# Patient Record
Sex: Female | Born: 1940 | Race: Black or African American | Hispanic: No | Marital: Single | Smoking: Never smoker
Health system: Southern US, Community
[De-identification: ages and names within clinical notes are randomized; demographics above are authoritative.]

## PROBLEM LIST (undated history)

## (undated) ENCOUNTER — Emergency Department (HOSPITAL_COMMUNITY): Admission: EM | Discharge status: Absent without leave | Payer: Medicare HMO

## (undated) DIAGNOSIS — G629 Polyneuropathy, unspecified: Secondary | ICD-10-CM

## (undated) DIAGNOSIS — I10 Essential (primary) hypertension: Secondary | ICD-10-CM

## (undated) DIAGNOSIS — E7800 Pure hypercholesterolemia, unspecified: Secondary | ICD-10-CM

## (undated) HISTORY — PX: ABDOMINAL HYSTERECTOMY: SHX81

## (undated) HISTORY — PX: BRAIN SURGERY: SHX531

## (undated) HISTORY — PX: CHOLECYSTECTOMY: SHX55

## (undated) HISTORY — PX: FOOT SURGERY: SHX648

---

## 2000-12-18 ENCOUNTER — Emergency Department (HOSPITAL_COMMUNITY): Admission: EM | Admit: 2000-12-18 | Discharge: 2000-12-19 | Payer: Self-pay | Admitting: Emergency Medicine

## 2000-12-18 ENCOUNTER — Encounter: Payer: Self-pay | Admitting: Emergency Medicine

## 2007-02-27 ENCOUNTER — Ambulatory Visit: Payer: Self-pay | Admitting: Cardiology

## 2008-05-23 DEATH — deceased

## 2017-09-21 ENCOUNTER — Emergency Department (HOSPITAL_COMMUNITY)
Admission: EM | Admit: 2017-09-21 | Discharge: 2017-09-22 | Disposition: A | Discharge status: Home / Self Care | Payer: Medicare HMO | Attending: Emergency Medicine | Admitting: Emergency Medicine

## 2017-09-21 ENCOUNTER — Other Ambulatory Visit: Payer: Self-pay

## 2017-09-21 ENCOUNTER — Encounter (HOSPITAL_COMMUNITY): Payer: Self-pay | Admitting: Emergency Medicine

## 2017-09-21 ENCOUNTER — Emergency Department (HOSPITAL_COMMUNITY): Payer: Medicare HMO

## 2017-09-21 DIAGNOSIS — M79672 Pain in left foot: Secondary | ICD-10-CM | POA: Diagnosis present

## 2017-09-21 DIAGNOSIS — I10 Essential (primary) hypertension: Secondary | ICD-10-CM | POA: Insufficient documentation

## 2017-09-21 HISTORY — DX: Polyneuropathy, unspecified: G62.9

## 2017-09-21 HISTORY — DX: Pure hypercholesterolemia, unspecified: E78.00

## 2017-09-21 HISTORY — DX: Essential (primary) hypertension: I10

## 2017-09-21 NOTE — ED Triage Notes (Signed)
Pt c/o left ankle/foot pain since yesterday with swelling. Pt denies any injury.

## 2017-09-22 NOTE — ED Provider Notes (Signed)
[facility] EMERGENCY DEPARTMENT Provider Note   CSN: [id number] Arrival date & time: 09/21/17  2159     History   Chief Complaint Chief Complaint  Patient presents with  . Foot Pain    HPI Catherine Hines is a 77 y.o. female with history of HLD, HTN, prediabetes, and peripheral neuropathy presents for evaluation of acute onset, constant left foot pain since yesterday.  She denies any known injury.  She notes a burning stabbing pain to the plantar aspect of the left foot radiating up to the dorsum of the left foot as well as intermittent numbness and tingling of the toes.  Pain worsens with ambulation, improves somewhat with rest and elevation.  She takes gabapentin for peripheral neuropathy but states this has not been helpful.  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  She states that she is taking a blood thinner which she began after the surgery but she is unsure which when she is taking.  States she has been compliant with this medication.  Denies any chest pain or significant shortness of breath, no fevers or chills.  She believes with a cane or walker at baseline.  The history is provided by the patient.    Past Medical History:  Diagnosis Date  . Hypercholesteremia   . Hypertension   . Neuropathy     There are no active problems to display for this patient.   Past Surgical History:  Procedure Laterality Date  . ABDOMINAL HYSTERECTOMY    . BRAIN SURGERY    . CHOLECYSTECTOMY    . FOOT SURGERY       OB History   None      Home Medications    Prior to Admission medications   Not on File    Family History No family history on file.  Social History Social History   Tobacco Use  . Smoking status: Never Smoker  . Smokeless tobacco: Never Used  Substance Use Topics  . Alcohol use: Not Currently  . Drug use: Not Currently     Allergies   Penicillins   Review of Systems Review of Systems    Constitutional: Negative for chills and fever.  Musculoskeletal: Positive for arthralgias.  Neurological: Positive for numbness. Negative for syncope and weakness.     Physical Exam Updated Vital Signs BP (!) 134/93 (BP Location: Left Arm)   Pulse 79   Temp 98.5 F (36.9 C) (Oral)   Resp 16   Ht 5\' 3"  (1.6 m)   Wt 73 kg (161 lb)   SpO2 99%   BMI 28.52 kg/m   Physical Exam  Constitutional: She appears well-developed and well-nourished. No distress.  HENT:  Head: Normocephalic and atraumatic.  Eyes: Conjunctivae are normal. Right eye exhibits no discharge. Left eye exhibits no discharge.  Neck: No JVD present. No tracheal deviation present.  Cardiovascular: Normal rate and intact distal pulses.  2+ DP/PT pulses bilaterally, Homans sign absent bilaterally, no lower extremity edema, no palpable cords, compartments are soft   Pulmonary/Chest: Effort normal and breath sounds normal. No stridor. No respiratory distress. She has no wheezes. She has no rales. She exhibits no tenderness.  Equal rise and fall of chest, no increased work of breathing.  Speaking in full sentences without difficulty.  Abdominal: She exhibits no distension.  Musculoskeletal: Normal range of motion. She exhibits tenderness. She exhibits no edema.  Tenderness to palpation along the medial arch of  the left foot near the heel.  Mild tenderness to palpation overlying the medial aspect of the foot and the first metatarsal.  5/5 strength of the bilateral lower extremities including EHL strength and plantarflexion/dorsiflexion strength bilaterally.  No significant swelling, ecchymosis, crepitus, or erythema noted.  Neurological: She is alert. A sensory deficit is present.  Fluent speech with no evidence of dysarthria or aphasia, no facial droop, altered sensation to the bilateral plantar and dorsal aspects of the feet.  Patient states this is chronic although appears to be worse on the left as compared to the right.   Ambulates with cane at baseline, ambulates with an antalgic limp, favoring the right.  Skin: Skin is warm and dry. No erythema.  Psychiatric: She has a normal mood and affect. Her behavior is normal.  Nursing note and vitals reviewed.    ED Treatments / Results  Labs (all labs ordered are listed, but only abnormal results are displayed) Labs Reviewed - No data to display  EKG None  Radiology Dg Ankle Complete Left  Result Date: 09/21/2017 CLINICAL DATA:  Pain and swelling EXAM: LEFT ANKLE COMPLETE - 3+ VIEW COMPARISON:  None. FINDINGS: There is no evidence of fracture, dislocation, or joint effusion. There is no evidence of arthropathy or other focal bone abnormality. Mild soft tissue swelling. IMPRESSION: No acute osseous abnormality Electronically Signed   By: Jasmine PangKim  Fujinaga M.D.   On: 09/21/2017 23:03   Dg Foot Complete Left  Result Date: 09/21/2017 CLINICAL DATA:  Left foot pain with swelling EXAM: LEFT FOOT - COMPLETE 3+ VIEW COMPARISON:  None. FINDINGS: No acute displaced fracture or malalignment. Degenerative changes at the first MTP joint. Possible old traumatic injury of the distal fifth proximal phalanx. No soft tissue gas or radiopaque foreign body. IMPRESSION: No acute osseous abnormality. Electronically Signed   By: Jasmine PangKim  Fujinaga M.D.   On: 09/21/2017 23:02    Procedures Procedures (including critical care time)  Medications Ordered in ED Medications - No data to display   Initial Impression / Assessment and Plan / ED Course  I have reviewed the triage vital signs and the nursing notes.  Pertinent labs & imaging results that were available during my care of the patient were reviewed by me and considered in my medical decision making (see chart for details).     Patient presents for evaluation of acute onset, constant left foot pain, primarily to the plantar aspect of the foot medially.  Denies any known trauma.  She is afebrile, vital signs are stable.  She is  nontoxic in appearance.  Her neuro exam is at her baseline.  Vascularly intact.  No signs of secondary skin infection.  Denies any significant shortness of breath, no increased work of breathing on examination.  No lower extremity edema, palpable cords, Homans sign absent bilaterally.  I have a low suspicion of DVT however cannot completely rule out secondary to recent surgery and older age.  Doubt PE.  She states that she is already anticoagulated but cannot tell me what blood thinner she is taking.  Radiographs show no acute osseous abnormalities, mild soft tissue swelling of the left ankle as noted on x-ray.  She is ambulatory without difficulty with cane at baseline despite pain.  Given the location of the pain, recommend taping and stretching exercises for management of pain that could be from plantar fasciitis.  Will set up for outpatient DVT study tomorrow as ultrasound is not available at this time.  Discussed strict ED return precautions.  Patient and patient's sister verbalized understanding of and agreement with plan and patient is stable for discharge home at this time.  Final Clinical Impressions(s) / ED Diagnoses   Final diagnoses:  Acute pain of left foot    ED Discharge Orders        Ordered    US Venous Img Lower Unilateral Left     09/22/17 0023       Jeanie Sewer, PA-C 09/22/17 0036    Devoria Albe, MD 09/22/17 [phone]

## 2017-09-22 NOTE — Discharge Instructions (Addendum)
Come tomorrow for your ultrasound to rule out blood clot.  Take [phone] mg of Tylenol every 6 hours as needed for pain for the next few days. Do not exceed 4000 mg of Tylenol daily.    Do some gentle stretching exercises in the morning and throughout the day to help with your pain.  You can freeze a water bottle and then roll it gently over the bottom of your foot.  I have also attached instructions on how to tape your feet which can help ease your pain.  Make sure to wear shoes that fit well.  Follow-up with your primary care physician for reevaluation of your symptoms.  Return to the emergency department if any concerning signs or symptoms develop such as fevers, shortness of breath or chest pain, severe swelling, loss of pulses or pallor to the extremity, or persistent numbness/weakness.

## 2017-09-24 ENCOUNTER — Ambulatory Visit (HOSPITAL_COMMUNITY)
Admission: RE | Admit: 2017-09-24 | Discharge: 2017-09-24 | Disposition: A | Discharge status: Home / Self Care | Payer: Medicare HMO | Source: Ambulatory Visit | Attending: Physician Assistant | Admitting: Physician Assistant

## 2017-09-24 DIAGNOSIS — M79605 Pain in left leg: Secondary | ICD-10-CM | POA: Insufficient documentation

## 2017-09-24 DIAGNOSIS — Z9889 Other specified postprocedural states: Secondary | ICD-10-CM | POA: Diagnosis not present

## 2017-09-24 NOTE — ED Provider Notes (Signed)
Pt returned for an outpatient US to eval for DVT.  Result:  IMPRESSION: Sonographic survey of left lower extremity negative for DVT   Electronically Signed   By: Gilmer MorJaime  Wagner D.O.   On: 09/24/2017 12:02  Results d/w patient.  Pt is stable for d/c.  F/u with pcp.   Catherine Hines, Catherine Lute, MD 09/24/17 1222

## 2018-08-02 IMAGING — US US EXTREM LOW VENOUS*L*
1 series · 13 of 24 positions shown · non-contrast
Comparison: None.

CLINICAL DATA: 77-year-old female with a history of left lower
extremity pain and numbness



[Series 1: us extrem low venous*left* · 0.08mm/px · 13 of 36 slices shown]
[im 1/36]
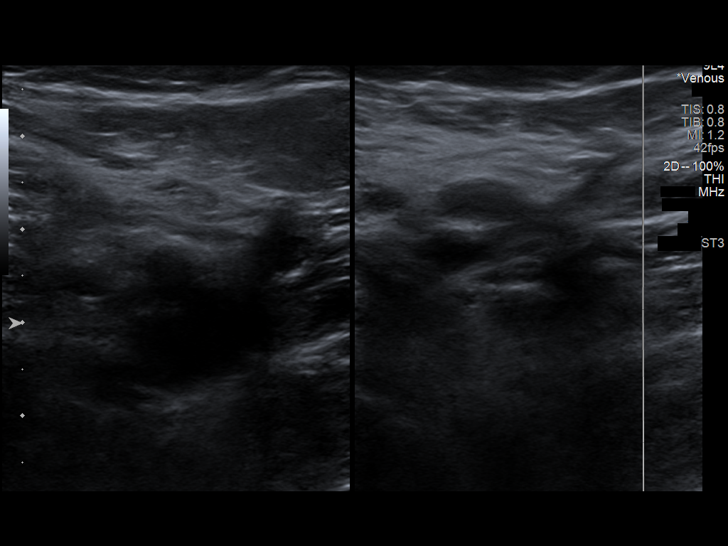
[im 4/36]
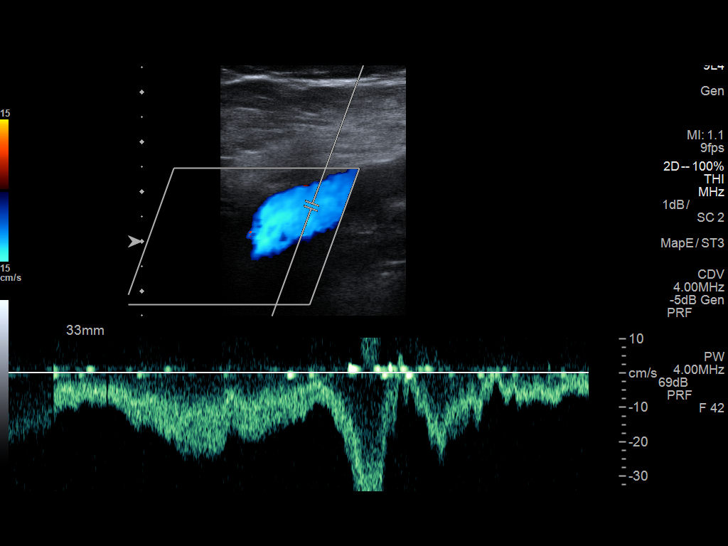
[im 7/36]
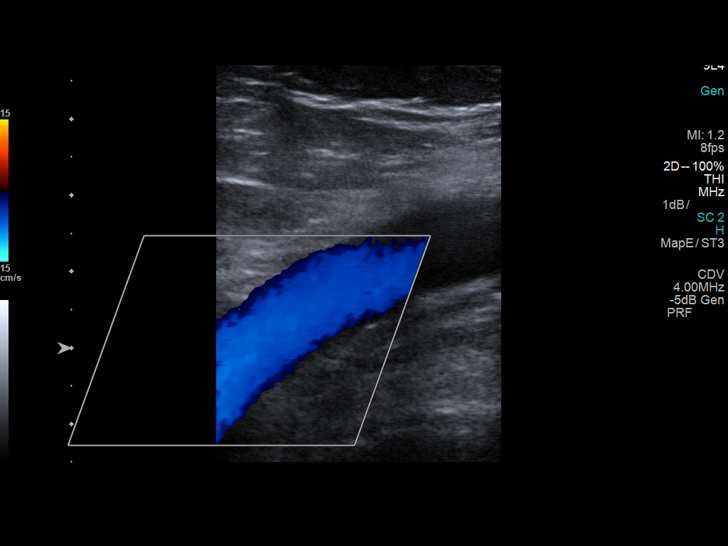
[im 10/36]
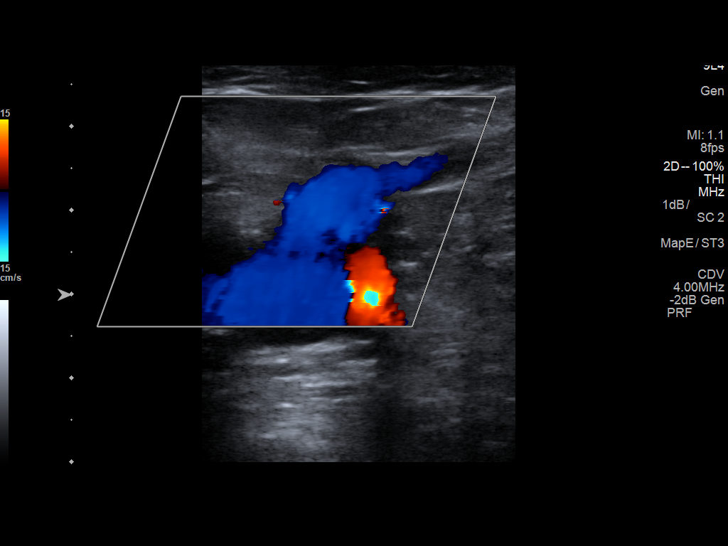
[im 13/36]
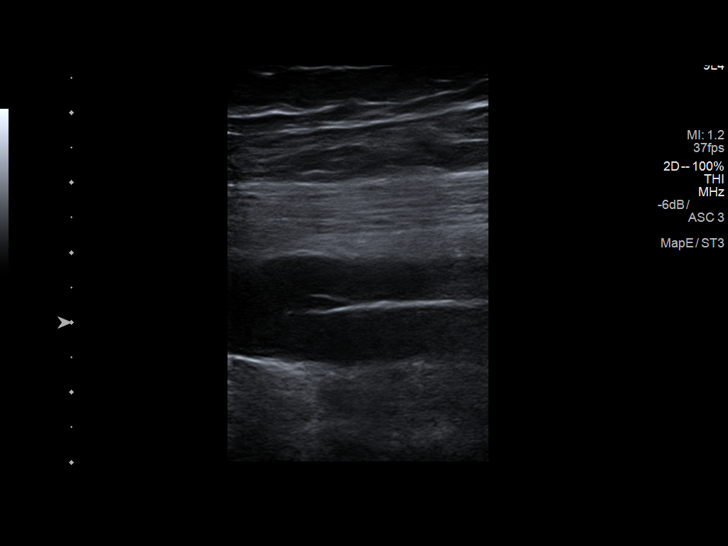
[im 16/36]
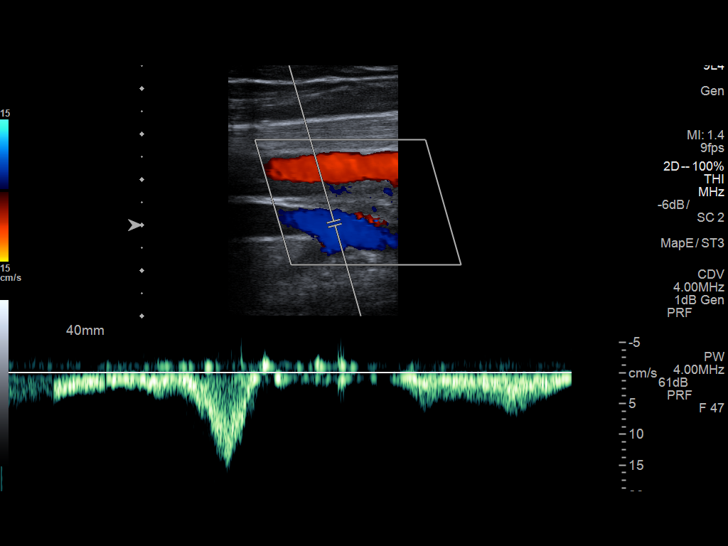
[im 19/36]
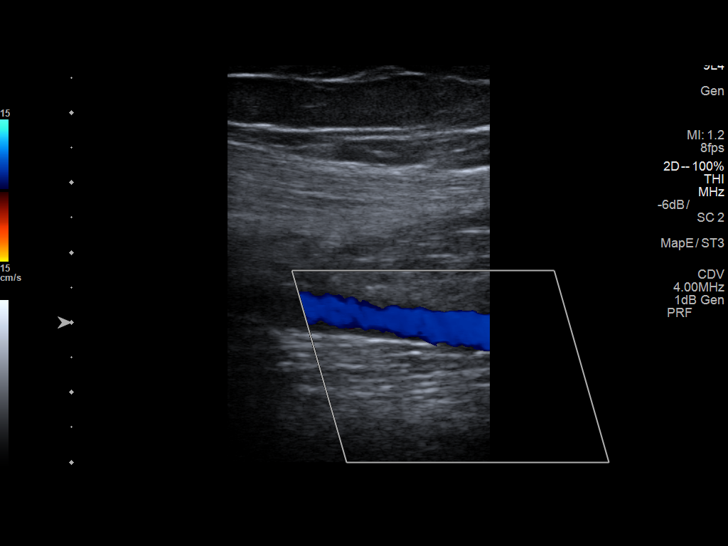
[im 20/36]
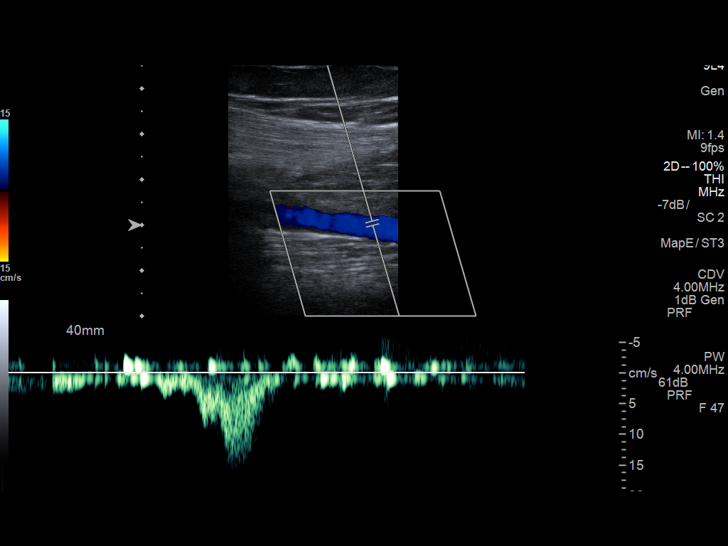
[im 23/36]
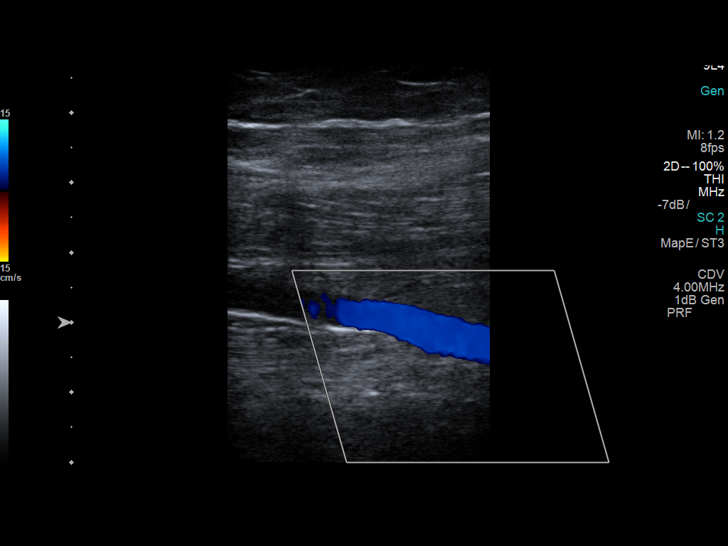
[im 26/36]
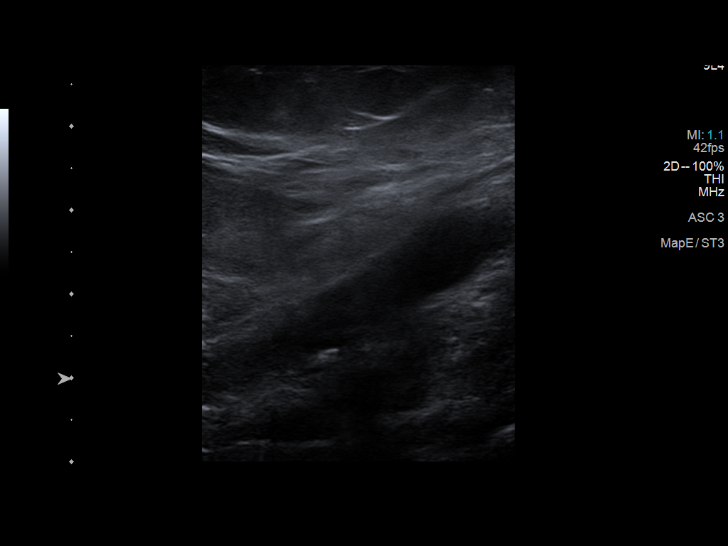
[im 29/36]
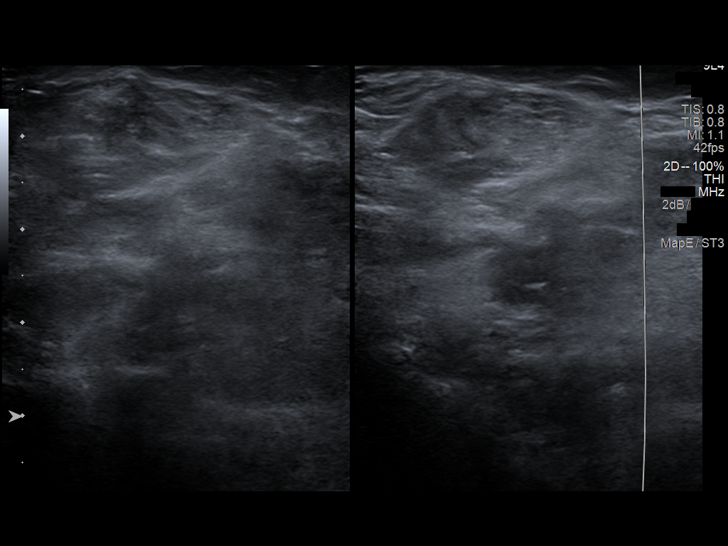
[im 32/36]
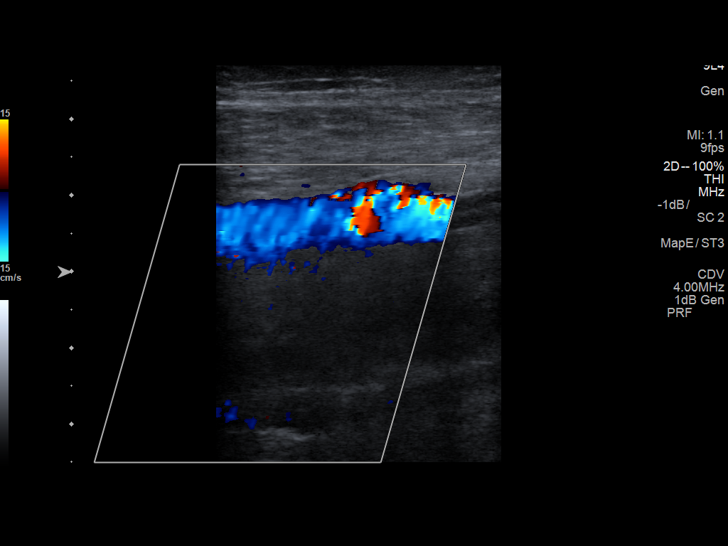
[im 36/36]
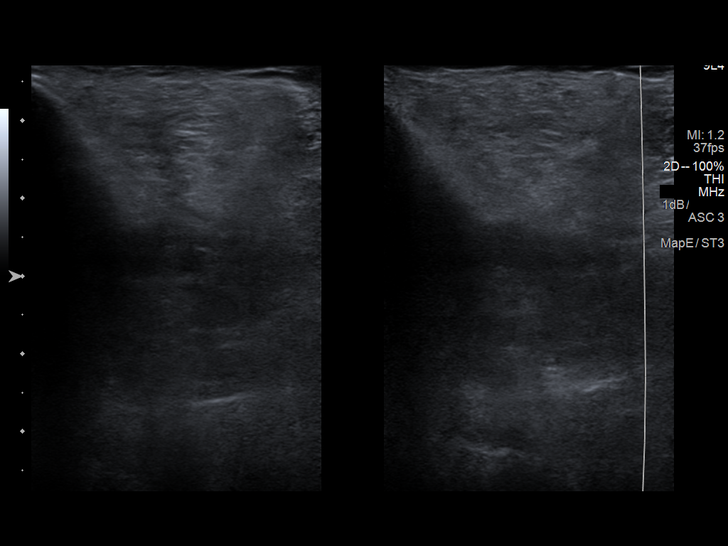

[13 of 24 positions shown; findings below may reference images not displayed]

FINDINGS: Contralateral Common Femoral Vein: Respiratory phasicity is normal
and symmetric with the symptomatic side. No evidence of thrombus.
Normal compressibility.

Common Femoral Vein: No evidence of thrombus. Normal
compressibility, respiratory phasicity and response to augmentation.

Saphenofemoral Junction: No evidence of thrombus. Normal
compressibility and flow on color Doppler imaging.

Profunda Femoral Vein: No evidence of thrombus. Normal
compressibility and flow on color Doppler imaging.

Femoral Vein: No evidence of thrombus. Normal compressibility,
respiratory phasicity and response to augmentation.

Popliteal Vein: No evidence of thrombus. Normal compressibility,
respiratory phasicity and response to augmentation.

Calf Veins: No evidence of thrombus. Normal compressibility and flow
on color Doppler imaging.

Superficial Great Saphenous Vein: No evidence of thrombus. Normal
compressibility and flow on color Doppler imaging.

Other Findings:  None.
IMPRESSION: Sonographic survey of left lower extremity negative for DVT

## 2018-12-18 IMAGING — DX DG ANKLE COMPLETE 3+V*L*
3 series · 3 of 3 positions shown · non-contrast
Comparison: None.

CLINICAL DATA: Pain and swelling

EXAM:
LEFT ANKLE COMPLETE - 3+ VIEW

[ankle ap]
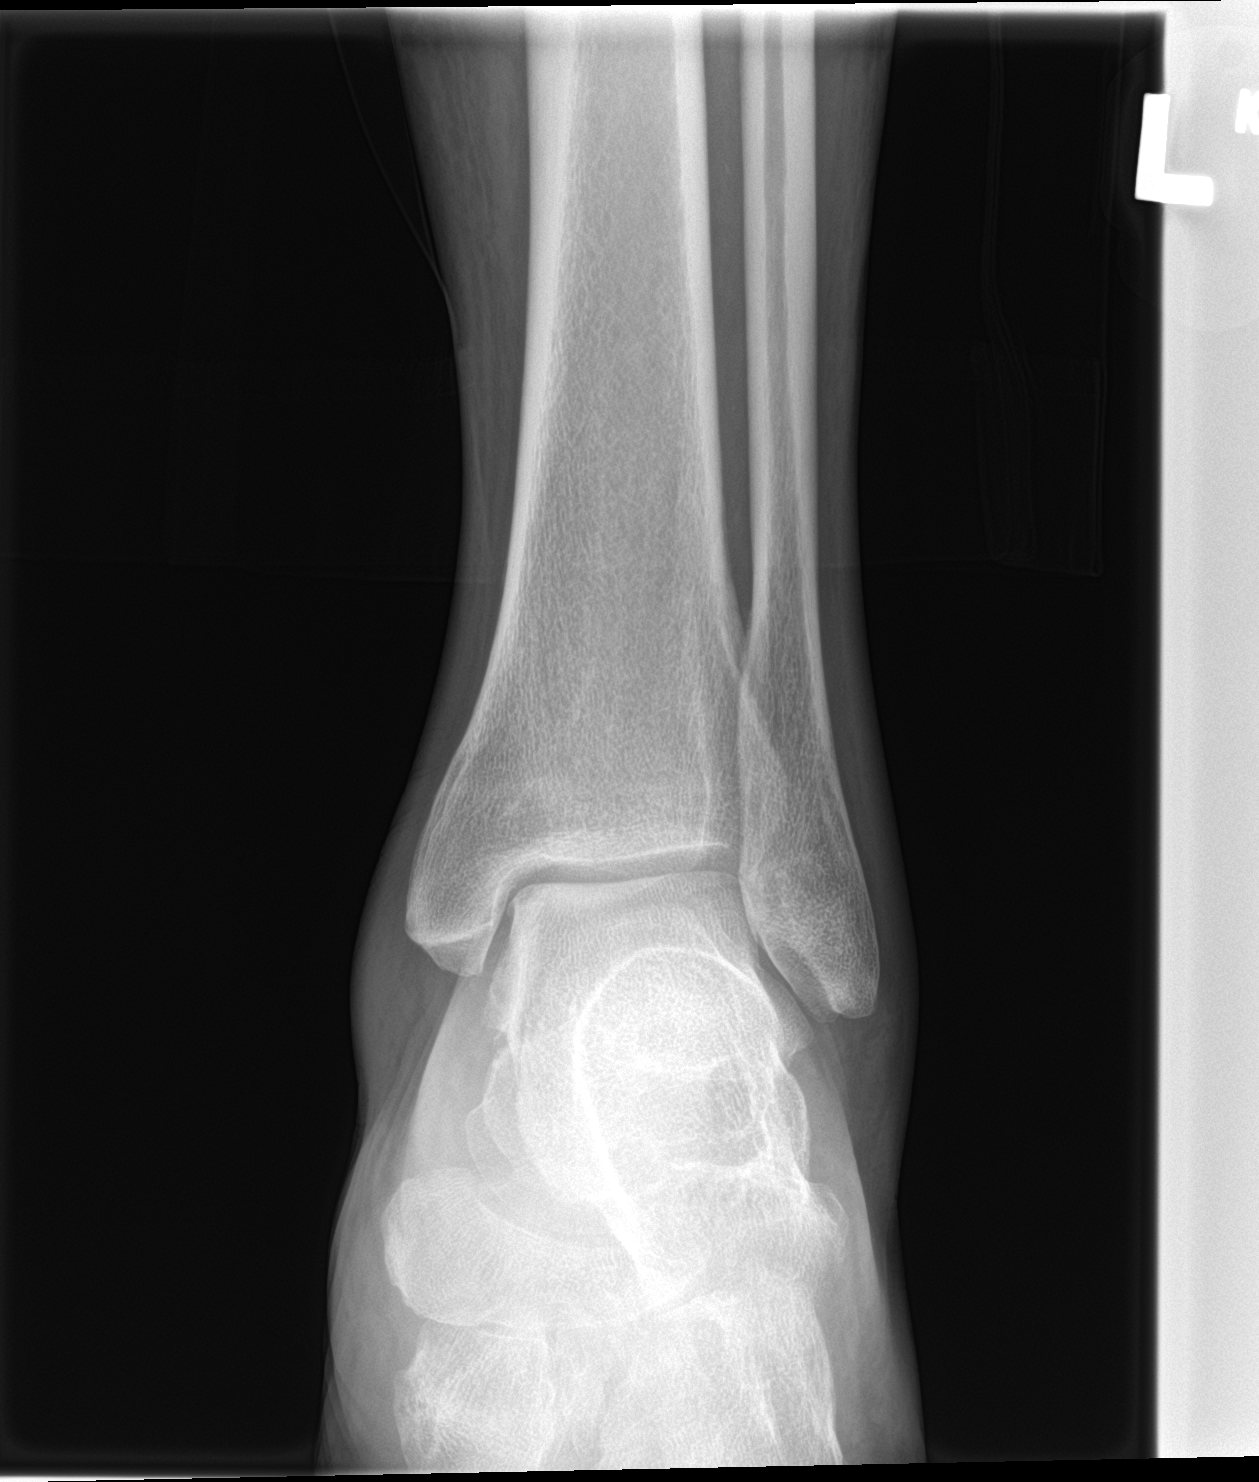

[ankle obl]
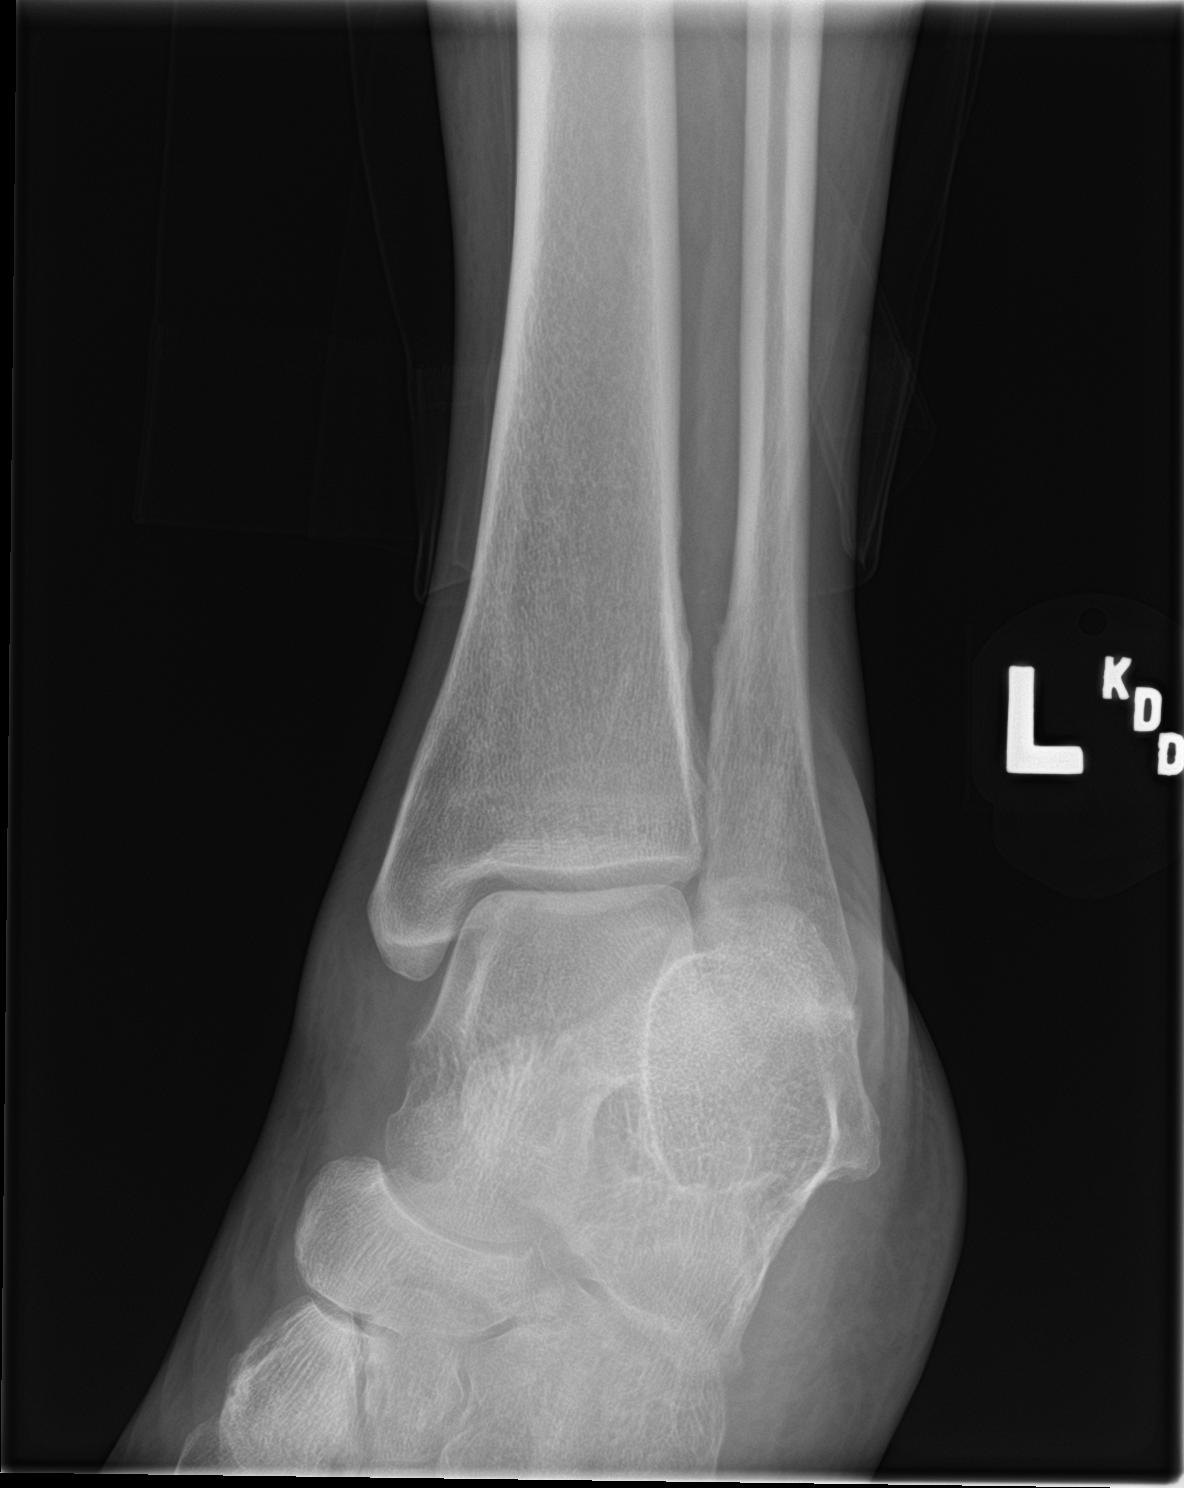

[ankle lat]
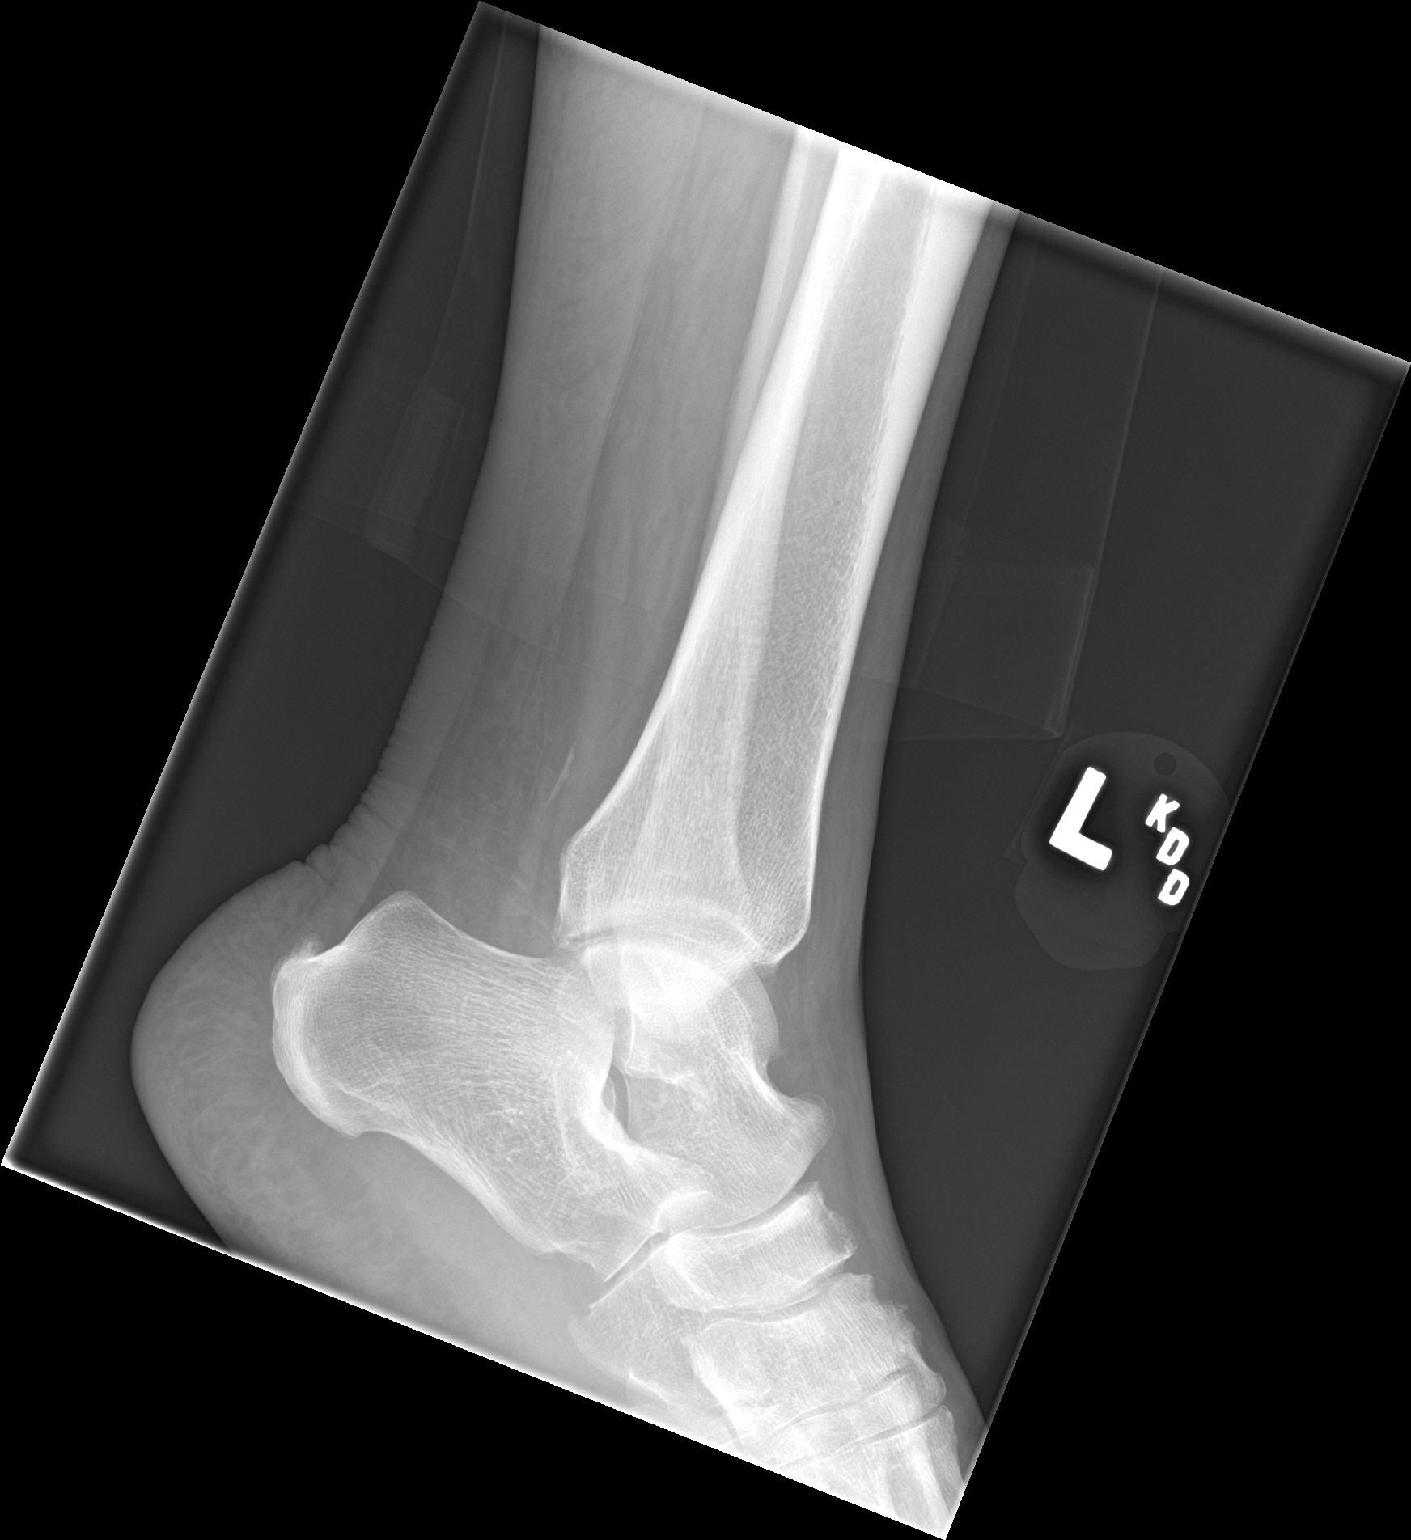

[3 of 3 positions shown; findings below may reference images not displayed]

FINDINGS: There is no evidence of fracture, dislocation, or joint effusion.
There is no evidence of arthropathy or other focal bone abnormality.
Mild soft tissue swelling.
IMPRESSION: No acute osseous abnormality
